# Patient Record
Sex: Male | Born: 1955 | Race: White | Hispanic: No | Marital: Single | Smoking: Never smoker
Health system: Southern US, Community
[De-identification: ages and names within clinical notes are randomized; demographics above are authoritative.]

## PROBLEM LIST (undated history)

## (undated) DIAGNOSIS — M5430 Sciatica, unspecified side: Secondary | ICD-10-CM

## (undated) DIAGNOSIS — M1990 Unspecified osteoarthritis, unspecified site: Secondary | ICD-10-CM

## (undated) DIAGNOSIS — T7840XA Allergy, unspecified, initial encounter: Secondary | ICD-10-CM

## (undated) DIAGNOSIS — J342 Deviated nasal septum: Secondary | ICD-10-CM

## (undated) DIAGNOSIS — J45909 Unspecified asthma, uncomplicated: Secondary | ICD-10-CM

## (undated) DIAGNOSIS — N400 Enlarged prostate without lower urinary tract symptoms: Secondary | ICD-10-CM

## (undated) DIAGNOSIS — E119 Type 2 diabetes mellitus without complications: Secondary | ICD-10-CM

## (undated) DIAGNOSIS — I10 Essential (primary) hypertension: Secondary | ICD-10-CM

## (undated) DIAGNOSIS — E785 Hyperlipidemia, unspecified: Secondary | ICD-10-CM

## (undated) HISTORY — DX: Type 2 diabetes mellitus without complications: E11.9

## (undated) HISTORY — DX: Benign prostatic hyperplasia without lower urinary tract symptoms: N40.0

## (undated) HISTORY — DX: Sciatica, unspecified side: M54.30

## (undated) HISTORY — DX: Allergy, unspecified, initial encounter: T78.40XA

## (undated) HISTORY — DX: Unspecified asthma, uncomplicated: J45.909

## (undated) HISTORY — PX: TONSILLECTOMY: SUR1361

## (undated) HISTORY — DX: Unspecified osteoarthritis, unspecified site: M19.90

## (undated) HISTORY — DX: Hyperlipidemia, unspecified: E78.5

## (undated) HISTORY — DX: Essential (primary) hypertension: I10

## (undated) HISTORY — DX: Deviated nasal septum: J34.2

---

## 2005-08-08 ENCOUNTER — Ambulatory Visit: Payer: Self-pay | Admitting: Family Medicine

## 2007-08-18 ENCOUNTER — Emergency Department: Payer: Self-pay

## 2013-06-27 ENCOUNTER — Ambulatory Visit: Payer: Self-pay | Admitting: Nurse Practitioner

## 2015-03-27 ENCOUNTER — Ambulatory Visit
Admit: 2015-03-27 | Disposition: A | Discharge status: Home / Self Care | Payer: Self-pay | Attending: Family Medicine | Admitting: Family Medicine

## 2017-02-06 ENCOUNTER — Encounter: Payer: Self-pay | Admitting: Pharmacist

## 2017-02-06 ENCOUNTER — Ambulatory Visit: Payer: Self-pay | Admitting: Pharmacy Technician

## 2017-02-06 NOTE — Progress Notes (Signed)
Patient scheduled for eligibility appointment at [facility] on 02/06/17 at 2:00p.m. & MTM at 4:00p.m.  Patient called to cancel due to weather.  Eligibility appointment rescheduled to 03/02/17 at 2:00p.m. And MTM rescheduled to 02/27/17 at 2:00p.m.  Sherilyn DacostaBetty J. Saxon Barich Care Manager [facility]

## 2017-02-27 ENCOUNTER — Encounter (INDEPENDENT_AMBULATORY_CARE_PROVIDER_SITE_OTHER): Payer: Self-pay

## 2017-03-02 ENCOUNTER — Ambulatory Visit: Payer: Self-pay | Admitting: Pharmacy Technician

## 2017-03-02 DIAGNOSIS — Z79899 Other long term (current) drug therapy: Secondary | ICD-10-CM

## 2017-03-02 NOTE — Progress Notes (Signed)
  Completed [facility] application and contract.  Patient agreed to all terms of the [facility] contract.   Provided patient with community resource material based on her particular needs.    Pharmacy Technician contacting [facility] to transfer prescriptions to [facility]  Patient approved to receive medication assistance through 2018, as long as total household income does not exceed 250% FPL or pt obtains prescription coverage.  [name] Care Manager [facility]

## 2017-03-16 ENCOUNTER — Ambulatory Visit: Payer: Self-pay | Admitting: Pharmacist

## 2017-03-16 ENCOUNTER — Encounter: Payer: Self-pay | Admitting: Pharmacist

## 2017-03-16 VITALS — BP 180/91 | Ht 66.0 in | Wt 198.0 lb

## 2017-03-16 DIAGNOSIS — N400 Enlarged prostate without lower urinary tract symptoms: Secondary | ICD-10-CM | POA: Insufficient documentation

## 2017-03-16 DIAGNOSIS — Z79899 Other long term (current) drug therapy: Secondary | ICD-10-CM

## 2017-03-16 DIAGNOSIS — I10 Essential (primary) hypertension: Secondary | ICD-10-CM | POA: Insufficient documentation

## 2017-03-16 DIAGNOSIS — E119 Type 2 diabetes mellitus without complications: Secondary | ICD-10-CM | POA: Insufficient documentation

## 2017-03-16 NOTE — Progress Notes (Signed)
[facility] Visit Note  Patient: Trevor Klein MRN: [id number] Date of Birth: 03/27/1956 PCP: Leanna Sato, MD   [name] 61 y.o. male presents for an Intitial MTM visit today. Objectives for todays visit include reconciling medications and medical history.  BP (!) 180/91   Wt 198 lb (89.8 kg)   Patient Information   Past Medical History:  Diagnosis Date  . Allergy   . Asthma   . Benign prostate hyperplasia   . Deviated septum   . Diabetes mellitus without complication (HCC)   . Hyperlipidemia   . Hypertension   . Sciatic leg pain       Past Surgical History:  Procedure Laterality Date  . TONSILLECTOMY  unk   pt was 61 yo     Family History  Problem Relation Age of Onset  . Diabetes Mother   . Stroke Mother   . High blood pressure Mother   . Asthma Father   . COPD Father     New Diagnoses (since last visit): n/a  Family Support: has a sister that gets medication here at [facility] but is in a similar situation, he does have good support with his church [address].  Lifestyle Diet: pt is on food stamps and sometimes only eats 2 meals/day Breakfast: cereal Lunch: microvable Dinner:microwavable Drinks: water, tea  Pt is in  [employer] in [place].  Current Exercise Habits: The patient does not participate in regular exercise at present       History  Alcohol Use  . Yes    Comment: socially      History  Smoking Status  . Never Smoker  Smokeless Tobacco  . Never Used      Health Maintenance  Topic Date Due  . HEMOGLOBIN A1C  August 26, 1956  . Hepatitis C Screening  [birth year]  . PNEUMOCOCCAL POLYSACCHARIDE VACCINE (1) 07/01/1957  . FOOT EXAM  07/01/1965  . OPHTHALMOLOGY EXAM  07/01/1965  . HIV Screening  07/01/1970  . TETANUS/TDAP  07/01/1974  . COLONOSCOPY  07/01/2005  . INFLUENZA VACCINE  06/28/2017   Outpatient Encounter Prescriptions as of 03/16/2017  Medication Sig  . albuterol (PROVENTIL  HFA;VENTOLIN HFA) 108 (90 Base) MCG/ACT inhaler Inhale 2 puffs into the lungs every 4 (four) hours as needed. EVERY 4-6 HOURS  . atorvastatin (LIPITOR) 80 MG tablet Take 80 mg by mouth at bedtime.  Marland Kitchen glipiZIDE (GLUCOTROL) 5 MG tablet Take 5 mg by mouth daily with breakfast.  . hydrochlorothiazide (HYDRODIURIL) 25 MG tablet Take 25 mg by mouth daily.  Marland Kitchen lisinopril (PRINIVIL,ZESTRIL) 40 MG tablet Take 40 mg by mouth daily.  . meloxicam (MOBIC) 15 MG tablet Take 15 mg by mouth daily.  . metFORMIN (GLUCOPHAGE) 1000 MG tablet Take 1,000 mg by mouth 2 (two) times daily with a meal.  . potassium chloride (K-DUR,KLOR-CON) 10 MEQ tablet Take 10 mEq by mouth 2 (two) times daily.  . tamsulosin (FLOMAX) 0.4 MG CAPS capsule Take 0.4 mg by mouth at bedtime.   No facility-administered encounter medications on file as of 03/16/2017.     Assessment and Plan:  Compliance/Adherance: pt states he has not been on his medication for 5-6 mos. - pt has not had money. He will be picking up his medications today. He is in the process of applying for disability.  HTN:  BP today was 180/91 -noticed significant ankle swelling and pt states that they stay swollen. He says they go down only a little bit at night. Counseled  pt about importance of taking BP meds going forward and the diuretic and that goal BP is 140/90. He states that he has been under a lot of stress with his financial situation and wants to get his BP under control.  Sciatic Nerve Pain - L leg; pt experiences pain often - has had a corticosteroid shot in the past and that has worked well. He has not had the money to get the shot recently.   I would like to see the patient back in 3 mos to reasses compliance and BP.   Denice Paradise, PharmD, RPh [facility]) [phone]

## 2017-04-11 ENCOUNTER — Telehealth: Payer: Self-pay | Admitting: Pharmacist

## 2017-04-11 NOTE — Telephone Encounter (Signed)
04/11/17 Faxing [facility] application for Ventolin HFA Inhale 2 puffs by mouth every 4 to 6 hours as needed for cough, wheezing or shortness of breath.

## 2017-06-30 ENCOUNTER — Telehealth: Payer: Self-pay | Admitting: Pharmacist

## 2017-06-30 NOTE — Telephone Encounter (Signed)
06/30/17 Placed refill with [facility] for Ventolin HFA, to release 07/13/17.AJ °

## 2017-09-07 ENCOUNTER — Telehealth: Payer: Self-pay | Admitting: Pharmacy Technician

## 2017-09-07 NOTE — Telephone Encounter (Signed)
Patient came into [facility] and stated that he had been to the [facility] office [place].  Patient wanting to find out about Medicare.  Patient is only 30, so he will not be eligible for Medicare until he is 5.  Attempted to call patient.  Received message stating the person that you are trying to reach is not accepting calls at this time.    [name] Care Manager [facility]

## 2017-11-09 ENCOUNTER — Telehealth: Payer: Self-pay | Admitting: Pharmacy Technician

## 2017-11-09 NOTE — Telephone Encounter (Signed)
Pt has Medicaid.  [facility].  Sherilyn DacostaBetty J. Klein Care Manager [facility]

## 2019-11-18 ENCOUNTER — Other Ambulatory Visit: Payer: Self-pay | Admitting: Sports Medicine

## 2019-11-18 DIAGNOSIS — M25522 Pain in left elbow: Secondary | ICD-10-CM

## 2019-11-18 DIAGNOSIS — S46312A Strain of muscle, fascia and tendon of triceps, left arm, initial encounter: Secondary | ICD-10-CM

## 2019-11-18 DIAGNOSIS — M19022 Primary osteoarthritis, left elbow: Secondary | ICD-10-CM

## 2019-11-18 DIAGNOSIS — M778 Other enthesopathies, not elsewhere classified: Secondary | ICD-10-CM

## 2019-12-02 ENCOUNTER — Other Ambulatory Visit: Payer: Self-pay

## 2019-12-02 ENCOUNTER — Ambulatory Visit
Admission: RE | Admit: 2019-12-02 | Discharge: 2019-12-02 | Disposition: A | Discharge status: Home / Self Care | Payer: Medicare Other | Source: Ambulatory Visit | Attending: Sports Medicine | Admitting: Sports Medicine

## 2019-12-02 DIAGNOSIS — M778 Other enthesopathies, not elsewhere classified: Secondary | ICD-10-CM | POA: Insufficient documentation

## 2019-12-02 DIAGNOSIS — S46312A Strain of muscle, fascia and tendon of triceps, left arm, initial encounter: Secondary | ICD-10-CM | POA: Diagnosis present

## 2019-12-02 DIAGNOSIS — M25522 Pain in left elbow: Secondary | ICD-10-CM | POA: Diagnosis present

## 2019-12-02 DIAGNOSIS — M19022 Primary osteoarthritis, left elbow: Secondary | ICD-10-CM | POA: Insufficient documentation

## 2020-07-29 DEATH — deceased

## 2021-05-03 IMAGING — MR MR ELBOW*L* W/O CM
6 series · 40 of 40 positions shown · non-contrast
Comparison: None.

CLINICAL DATA: Posterior elbow pain following injury approximately
6 months ago. Possible left triceps tendon rupture.

EXAM:
MRI OF THE LEFT ELBOW WITHOUT CONTRAST
TECHNIQUE: Multiplanar, multisequence MR imaging of the elbow was performed. No
intravenous contrast was administered.

[Series 3: T1 · axial · left · 3.0mm · 0.47mm/px · z∈[-85,+11]mm · 6 of 25 slices shown (1 of 3)]
[im 1/25]
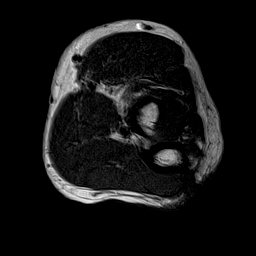
[im 5/25]
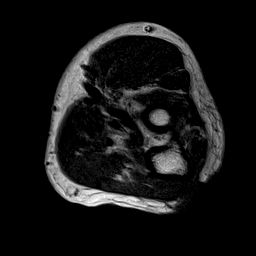
[im 10/25]
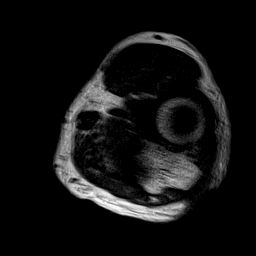
[im 15/25]
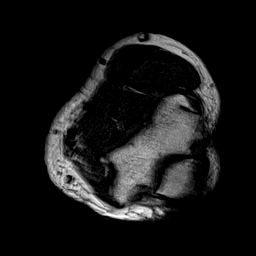
[im 20/25]
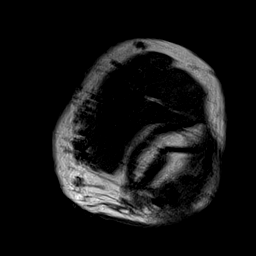
[im 25/25]
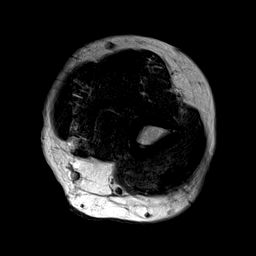

[Series 7: T1 · axial · left · 3.0mm · 0.47mm/px · z∈[-61,+35]mm · 7 of 25 slices shown (2 of 3)]
[im 1/25]
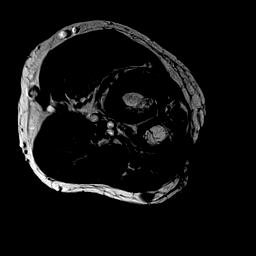
[im 5/25]
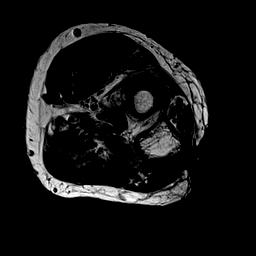
[im 9/25]
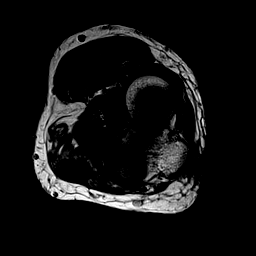
[im 13/25]
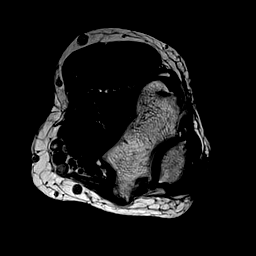
[im 17/25]
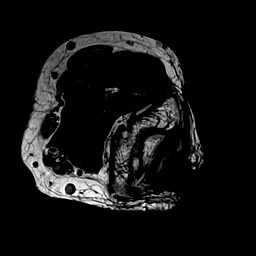
[im 21/25]
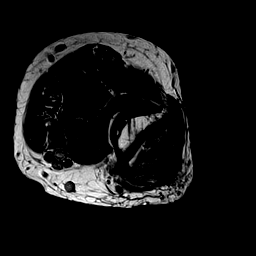
[im 25/25]
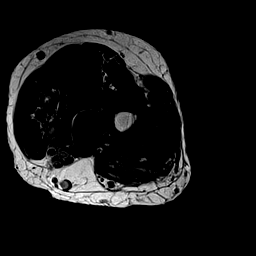

[Series 8: T2 fat-sat · axial · left · 3.0mm · 0.47mm/px · z∈[-61,+35]mm · 7 of 25 slices shown (1 of 2)]
[im 1/25]
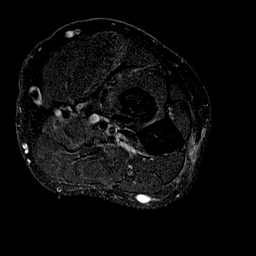
[im 5/25]
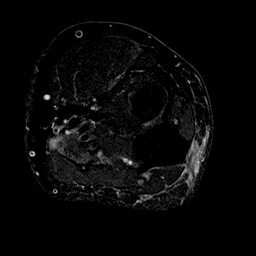
[im 9/25]
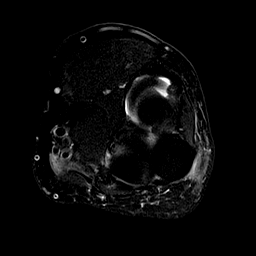
[im 13/25]
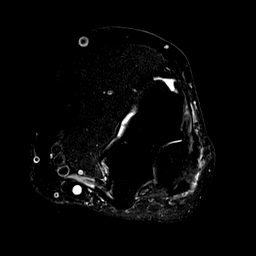
[im 17/25]
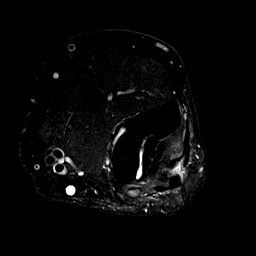
[im 21/25]
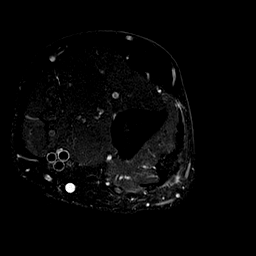
[im 25/25]
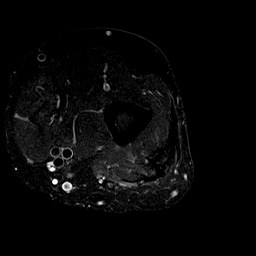

[Series 9: T2 fat-sat · sagittal · left · 3.0mm · 0.55mm/px · 7 of 26 slices shown (2 of 2)]
[im 1/26]
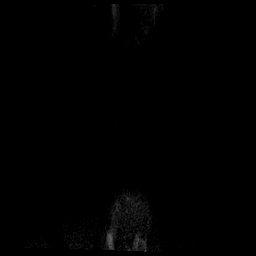
[im 5/26]
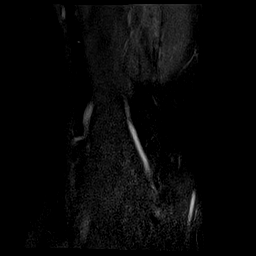
[im 9/26]
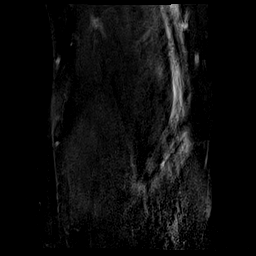
[im 13/26]
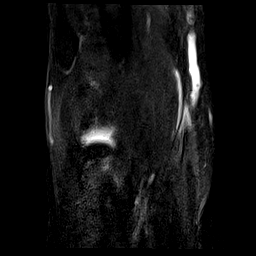
[im 17/26]
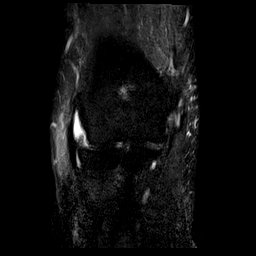
[im 21/26]
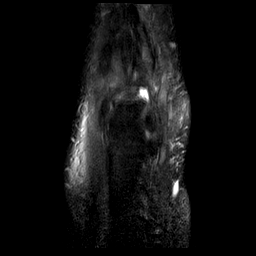
[im 26/26]
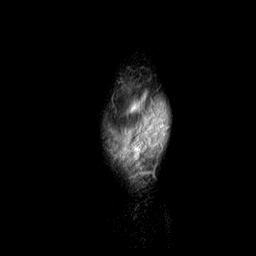

[Series 10: T1 · sagittal · left · 3.0mm · 0.55mm/px · 7 of 27 slices shown (3 of 3)]
[im 1/27]
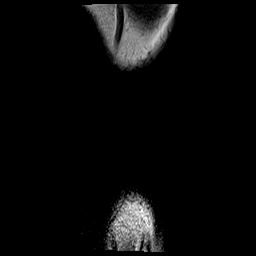
[im 5/27]
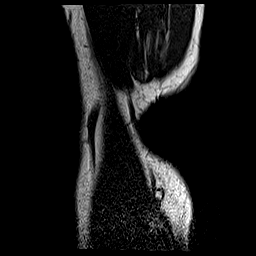
[im 9/27]
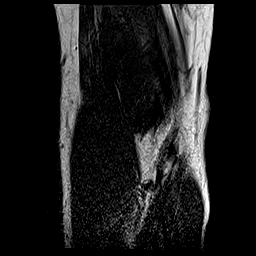
[im 14/27]
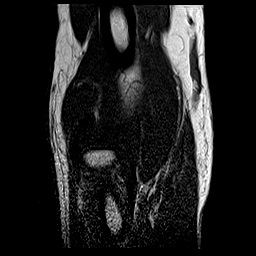
[im 18/27]
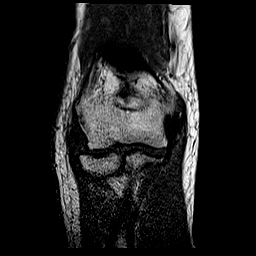
[im 22/27]
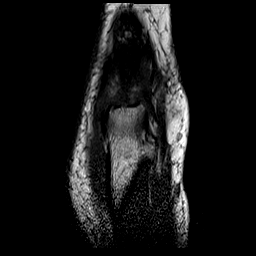
[im 27/27]
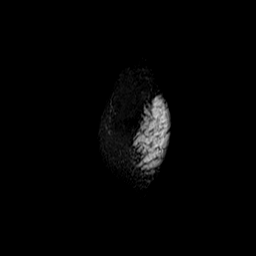

[Series 11: PD fat-sat · coronal · left · 3.0mm · 0.55mm/px · 6 of 23 slices shown]
[im 1/23]
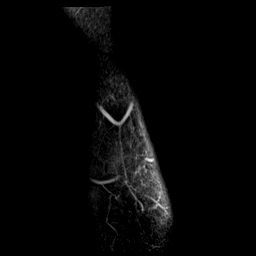
[im 5/23]
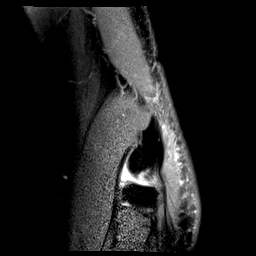
[im 9/23]
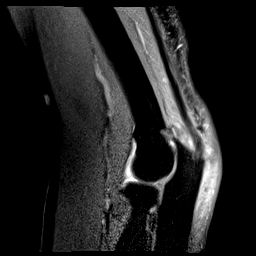
[im 14/23]
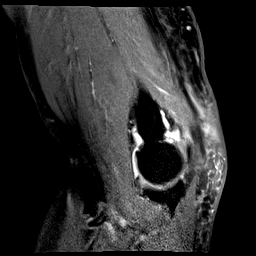
[im 18/23]
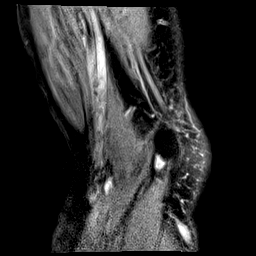
[im 23/23]
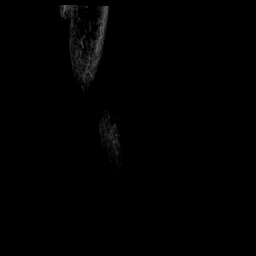

[40 of 40 positions shown; findings below may reference images not displayed]

FINDINGS: Despite efforts by the technologist and patient, mild motion
artifact is present on today's exam and could not be eliminated.
This reduces exam sensitivity and specificity.

TENDONS

Common forearm flexor origin: Tendinosis and partial intrasubstance
tearing without tendon retraction.

Common forearm extensor origin: There is fragmented spurring along
the lateral humeral epicondyle associated with irregular thickening
of the common extensor tendon, probably related to chronic partial
tearing. There is no significant soft tissue edema in this region.

Biceps: Intact.

Triceps: There is a partial-thickness insertional tear of the
central triceps tendon involving the superficial tendon. There is an
approximately 5 mm gap in the tendon on axial image [DATE], but no
significant tendon retraction. The deep central tendon as well as
the peripheral medial and lateral insertions of the tendon remain
intact. There is a small amount of fluid within the olecranon bursa.

LIGAMENTS

Medial stabilizers: Intact.

Lateral stabilizers: The lateral ulnar collateral ligament is
intact. The radial collateral ligament is not well visualized and
may be chronically torn.

Cartilage: Mild chondral thinning and spurring in the ulnohumeral
and radiocapitellar compartments.

Joint: Small joint effusion. No intra-articular loose bodies
observed.

Cubital tunnel: Unremarkable.  The ulnar nerve appears normal.

Bones: No acute or significant extra-articular osseous findings.

Other: Posterior subcutaneous edema adjacent to the triceps tendon
tear and associated olecranon bursitis.
IMPRESSION: 1. Partial-thickness insertional tear of the triceps tendon
involving the superficial central tendon. No significant tendon
retraction.
2. Tendinosis and partial intrasubstance tearing of the common
flexor tendon.
3. Probable chronic partial tearing of the common extensor tendon
and the radial collateral ligament.
4. Mild elbow degenerative changes. No acute osseous findings.
